# Patient Record
Sex: Male | Born: 1955 | Race: White | Hispanic: No | Marital: Married | Smoking: Former smoker
Health system: Southern US, Community
[De-identification: ages and names within clinical notes are randomized; demographics above are authoritative.]

## PROBLEM LIST (undated history)

## (undated) DIAGNOSIS — I4891 Unspecified atrial fibrillation: Secondary | ICD-10-CM

## (undated) DIAGNOSIS — E785 Hyperlipidemia, unspecified: Secondary | ICD-10-CM

## (undated) HISTORY — DX: Unspecified atrial fibrillation: I48.91

## (undated) HISTORY — DX: Hyperlipidemia, unspecified: E78.5

---
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
## 2006-06-21 ENCOUNTER — Ambulatory Visit (HOSPITAL_COMMUNITY): Admission: RE | Admit: 2006-06-21 | Discharge: 2006-06-21 | Payer: Self-pay | Admitting: Family Medicine

## 2007-09-28 ENCOUNTER — Ambulatory Visit: Payer: Self-pay | Admitting: Gastroenterology

## 2007-10-07 ENCOUNTER — Telehealth: Payer: Self-pay | Admitting: Gastroenterology

## 2007-10-11 ENCOUNTER — Telehealth: Payer: Self-pay | Admitting: Gastroenterology

## 2007-10-12 ENCOUNTER — Ambulatory Visit: Payer: Self-pay | Admitting: Gastroenterology

## 2010-01-21 ENCOUNTER — Encounter (INDEPENDENT_AMBULATORY_CARE_PROVIDER_SITE_OTHER): Payer: Self-pay | Admitting: *Deleted

## 2010-03-04 ENCOUNTER — Ambulatory Visit
Admission: RE | Admit: 2010-03-04 | Discharge: 2010-03-04 | Payer: Self-pay | Source: Home / Self Care | Attending: Gastroenterology | Admitting: Gastroenterology

## 2010-03-04 ENCOUNTER — Encounter: Payer: Self-pay | Admitting: Gastroenterology

## 2010-03-04 ENCOUNTER — Other Ambulatory Visit: Payer: Self-pay | Admitting: Gastroenterology

## 2010-03-04 DIAGNOSIS — R195 Other fecal abnormalities: Secondary | ICD-10-CM | POA: Insufficient documentation

## 2010-03-04 LAB — CBC WITH DIFFERENTIAL/PLATELET
Basophils Absolute: 0.1 10*3/uL (ref 0.0–0.1)
Basophils Relative: 1 % (ref 0.0–3.0)
Eosinophils Absolute: 0.2 10*3/uL (ref 0.0–0.7)
Eosinophils Relative: 2.1 % (ref 0.0–5.0)
HCT: 43.5 % (ref 39.0–52.0)
Hemoglobin: 15.2 g/dL (ref 13.0–17.0)
Lymphocytes Relative: 31.7 % (ref 12.0–46.0)
Lymphs Abs: 3.1 10*3/uL (ref 0.7–4.0)
MCHC: 34.9 g/dL (ref 30.0–36.0)
MCV: 92.2 fl (ref 78.0–100.0)
Monocytes Absolute: 0.7 10*3/uL (ref 0.1–1.0)
Monocytes Relative: 7.5 % (ref 3.0–12.0)
Neutro Abs: 5.6 10*3/uL (ref 1.4–7.7)
Neutrophils Relative %: 57.7 % (ref 43.0–77.0)
Platelets: 182 10*3/uL (ref 150.0–400.0)
RBC: 4.72 Mil/uL (ref 4.22–5.81)
RDW: 12.6 % (ref 11.5–14.6)
WBC: 9.7 10*3/uL (ref 4.5–10.5)

## 2010-03-26 NOTE — Letter (Signed)
Summary: New Patient letter  [facility]  [address]   [place], [place] [address]   Phone: [phone]  Fax: [phone]       01/21/2010 MRN: [id number]  Aaron Wise [address] [place], [place]  [address]  Dear Mr. Appelhans,  Welcome to the Gastroenterology Division at [facility].    You are scheduled to see Dr. Arlyce Dice on 03/04/2010 at 2:00 on the 3rd floor at [facility], [address]  We ask that you try to arrive at our office 15 minutes prior to your appointment time to allow for check-in.  We would like you to complete the enclosed self-administered evaluation form prior to your visit and bring it with you on the day of your appointment.  We will review it with you.  Also, please bring a complete list of all your medications or, if you prefer, bring the medication bottles and we will list them.  Please bring your insurance card so that we may make a copy of it.  If your insurance requires a referral to see a specialist, please bring your referral form from your primary care physician.  Co-payments are due at the time of your visit and may be paid by cash, check or credit card.     Your office visit will consist of a consult with your physician (includes a physical exam), any laboratory testing he/she may order, scheduling of any necessary diagnostic testing (e.g. x-ray, ultrasound, CT-scan), and scheduling of a procedure (e.g. Endoscopy, Colonoscopy) if required.  Please allow enough time on your schedule to allow for any/all of these possibilities.    If you cannot keep your appointment, please call [phone] to cancel or reschedule prior to your appointment date.  This allows [place] the opportunity to schedule an appointment for another patient in need of care.  If you do not cancel or reschedule by 5 p.m. the business day prior to your appointment date, you will be charged a $50.00 late cancellation/no-show fee.    Thank you for choosing  [facility] for your medical needs.  We appreciate the opportunity to care for you.  Please visit [place] at our website  to learn more about our practice.                     Sincerely,                                                             The Gastroenterology Division

## 2010-03-28 NOTE — Assessment & Plan Note (Signed)
Summary: Blood in stool..jj.   History of Present Illness Visit Type: Initial Consult Primary GI MD: Melvia Heaps MD [facility] Primary Provider: Jackalyn Lombard MD Requesting Provider: Jackalyn Lombard MD Chief Complaint: hem positive stool History of Present Illness:   Aaron Wise is a pleasant 55 year old white male referred at the request of Dr. Ludwig Clarks for Hemoccult-positive stools.  This was noted on routine testing.  [name] has no GI complaints including change of bowel habits, abdominal or rectal pain.  Rarely has he seen a minimal amount of blood on the toilet tissue that he attributes to hemorrhoids.  Screening colonoscopy in 2009 was remarkable only for scattered diverticula.  He is on no gastric irritants including nonsteroidals.  He has no history of upper GI complaints or disease.   GI Review of Systems      Denies abdominal pain, acid reflux, belching, bloating, chest pain, dysphagia with liquids, dysphagia with solids, heartburn, loss of appetite, nausea, vomiting, vomiting blood, weight loss, and  weight gain.      Reports constipation, heme positive stool, and  hemorrhoids.     Denies anal fissure, black tarry stools, change in bowel habit, diarrhea, diverticulosis, fecal incontinence, irritable bowel syndrome, jaundice, light color stool, liver problems, rectal bleeding, and  rectal pain.    Current Medications (verified): 1)  Simvastatin 40 Mg Tabs (Simvastatin) .Marland Kitchen.. 1 By Mouth Once Daily  Allergies (verified): No Known Drug Allergies  Past History:  Past Medical History:  Diverticulosis Hyperlipidemia  Past Surgical History: Hernia Surgery  Family History: No FH of Colon Cancer: father skin cancer in neck mother cancer in the jaw  Social History: Patient currently smokes. 1 ppd  Alcohol Use - yes 1-2 per day Daily Caffeine Use 4-6 per day Illicit Drug Use - no Patient gets regular exercise. Smoking Status:  current Drug Use:  no Does Patient  Exercise:  yes  Review of Systems       The patient complains of back pain.         All other systems were reviewed and were negative   Vital Signs:  Patient profile:   55 year old male Height:      71 inches Weight:      235 pounds BMI:     32.89 Pulse rate:   72 / minute Pulse rhythm:   regular BP sitting:   110 / 70  (right arm)  Vitals Entered By: Chales Abrahams CMA Duncan Dull) (March 04, 2010 2:04 PM)  Physical Exam  Additional Exam:  N. physical exam he is a well-developed well-nourished male  skin: anicter  HEENT: normocephalic; PEERLA; no nasal or orpharyngeal abnormalities neck: supple nodes: no cervical adenopathy chest: clear cor:  no murmurs, gallops or rubs abd:  bowel sounds normoactive; no abdominal masses, tenderness, organomegaly rectal: no masses; stool heme negative ext: no cyanosis, clubbing, or edema skeletal: no gross skeletal abnormalities neuro: alert, oriented x 3; no focal abnormalities    Impression & Recommendations:  Problem # 1:  FECAL OCCULT BLOOD (ICD-792.1) Hemoccult-Positive stool very well could be due to hemorrhoidal disease.  Colonoscopy in 2009 demonstrating  diverticulosis renders a more proximal colonic bleeding  source lesslikely.  Occult upper GI bleeding is a consideration.  Recommendations #1 CBC #2 followup Hemoccults #3 unless the patient is anemic or persistent Hemoccult-positive I would not pursue his GI workup any further at this point Orders: TLB-CBC Platelet - w/Differential ([id number]-[id number])  Patient Instructions: 1)  Copy sent to : Jackalyn Lombard MD

## 2010-03-28 NOTE — Letter (Signed)
Summary: [facility]: Immunoassay Fecal Occult Blood (iFOB) Order Form  [facility]  [address] [place], [place] [address]   Phone: [phone]  Fax: [phone]      [facility]: Immunoassay Fecal Occult Blood (iFOB) Order Form   March 04, 2010 MRN: [id number]   [name] 07/10/1955   Physicican Name:ROBERT KAPLAN Diagnosis Code:792.1      Merri Ray CMA (AAMA)

## 2010-03-28 NOTE — Letter (Signed)
Summary: Results Letter  [facility]  [address] [place], [place] [address]   Phone: [phone]  Fax: [phone]        March 04, 2010 MRN: [id number]    ADLAI SINNING [address] [place], [place]  [address]    Dear Mr. Hollerbach,  It is my pleasure to have treated you recently as a new patient in my office. I appreciate your confidence and the opportunity to participate in your care.  Since I do have a busy inpatient endoscopy schedule and office schedule, my office hours vary weekly. I am, however, available for emergency calls everyday through my office. If I am not available for an urgent office appointment, another one of our gastroenterologist will be able to assist you.  My well-trained staff are prepared to help you at all times. For emergencies after office hours, a physician from our Gastroenterology section is always available through my 24 hour answering service  Once again I welcome you as a new patient and I look forward to a happy and healthy relationship             Sincerely,  Louis Meckel MD  This letter has been electronically signed by your physician.  Appended Document: Results Letter LETTER MAILED

## 2010-04-11 ENCOUNTER — Encounter (INDEPENDENT_AMBULATORY_CARE_PROVIDER_SITE_OTHER): Payer: Self-pay | Admitting: *Deleted

## 2010-04-11 ENCOUNTER — Other Ambulatory Visit: Payer: Self-pay | Admitting: Gastroenterology

## 2010-04-11 ENCOUNTER — Other Ambulatory Visit: Payer: Self-pay

## 2010-04-11 DIAGNOSIS — R195 Other fecal abnormalities: Secondary | ICD-10-CM

## 2010-04-11 LAB — FECAL OCCULT BLOOD, IMMUNOCHEMICAL: Fecal Occult Bld: NEGATIVE

## 2010-04-12 ENCOUNTER — Encounter: Payer: Self-pay | Admitting: Gastroenterology

## 2010-04-17 NOTE — Letter (Signed)
Summary: Results Letter  [facility]  [address] [place], [place] [address]   Phone: [phone]  Fax: [phone]        April 12, 2010 MRN: [id number]    WILIAM CAUTHORN [address] [place], [place]  [address]    Dear Mr. Madeira,  Your hemeoccult cards testing for microscopic bleeding were negative.  No further GI workup is required at this time.   Please continue with the recommendations that we previously discussed. Should you have any further questions or concerns, feel free to contact me.    Sincerely,  Barbette Hair. Arlyce Dice, M.D., [facility]          Sincerely,  Louis Meckel MD  This letter has been electronically signed by your physician.  Appended Document: Results Letter Letter is mailed to the patient's home address

## 2011-02-25 HISTORY — PX: OTHER SURGICAL HISTORY: SHX169

## 2011-04-05 ENCOUNTER — Encounter (HOSPITAL_COMMUNITY): Payer: Self-pay | Admitting: Emergency Medicine

## 2011-04-05 ENCOUNTER — Emergency Department (HOSPITAL_COMMUNITY): Payer: Managed Care, Other (non HMO)

## 2011-04-05 ENCOUNTER — Emergency Department (HOSPITAL_COMMUNITY)
Admission: EM | Admit: 2011-04-05 | Discharge: 2011-04-05 | Disposition: A | Discharge status: Home / Self Care | Payer: Managed Care, Other (non HMO) | Attending: Emergency Medicine | Admitting: Emergency Medicine

## 2011-04-05 ENCOUNTER — Other Ambulatory Visit: Payer: Self-pay

## 2011-04-05 DIAGNOSIS — IMO0002 Reserved for concepts with insufficient information to code with codable children: Secondary | ICD-10-CM

## 2011-04-05 DIAGNOSIS — W19XXXA Unspecified fall, initial encounter: Secondary | ICD-10-CM

## 2011-04-05 DIAGNOSIS — W010XXA Fall on same level from slipping, tripping and stumbling without subsequent striking against object, initial encounter: Secondary | ICD-10-CM | POA: Insufficient documentation

## 2011-04-05 DIAGNOSIS — Z79899 Other long term (current) drug therapy: Secondary | ICD-10-CM | POA: Insufficient documentation

## 2011-04-05 DIAGNOSIS — F17200 Nicotine dependence, unspecified, uncomplicated: Secondary | ICD-10-CM | POA: Insufficient documentation

## 2011-04-05 DIAGNOSIS — S42409A Unspecified fracture of lower end of unspecified humerus, initial encounter for closed fracture: Secondary | ICD-10-CM | POA: Insufficient documentation

## 2011-04-05 DIAGNOSIS — M25529 Pain in unspecified elbow: Secondary | ICD-10-CM | POA: Insufficient documentation

## 2011-04-05 LAB — URINALYSIS, ROUTINE W REFLEX MICROSCOPIC
Glucose, UA: NEGATIVE mg/dL
Leukocytes, UA: NEGATIVE
Protein, ur: NEGATIVE mg/dL
Specific Gravity, Urine: 1.03 (ref 1.005–1.030)
Urobilinogen, UA: 0.2 mg/dL (ref 0.0–1.0)

## 2011-04-05 LAB — CBC
MCH: 32 pg (ref 26.0–34.0)
Platelets: 224 10*3/uL (ref 150–400)
RBC: 5.22 MIL/uL (ref 4.22–5.81)
WBC: 14.2 10*3/uL — ABNORMAL HIGH (ref 4.0–10.5)

## 2011-04-05 LAB — BASIC METABOLIC PANEL
CO2: 23 mEq/L (ref 19–32)
Calcium: 9.4 mg/dL (ref 8.4–10.5)
Chloride: 105 mEq/L (ref 96–112)
Potassium: 4.1 mEq/L (ref 3.5–5.1)
Sodium: 140 mEq/L (ref 135–145)

## 2011-04-05 MED ORDER — MORPHINE SULFATE 4 MG/ML IJ SOLN: 4.0000 mg | Freq: Once | INTRAMUSCULAR | Status: DC

## 2011-04-05 MED ORDER — HYDROMORPHONE HCL 2 MG PO TABS
2.0000 mg | ORAL_TABLET | ORAL | Status: AC
  Administered 2011-04-05: 2 mg via ORAL
  Filled 2011-04-05: qty 1

## 2011-04-05 MED ORDER — FENTANYL CITRATE 0.05 MG/ML IJ SOLN
50.0000 ug | Freq: Once | INTRAMUSCULAR | Status: AC
  Administered 2011-04-05: 50 ug via INTRAVENOUS
  Filled 2011-04-05: qty 2

## 2011-04-05 MED ORDER — HYDROMORPHONE HCL PF 1 MG/ML IJ SOLN
1.0000 mg | Freq: Once | INTRAMUSCULAR | Status: AC
  Administered 2011-04-05: 1 mg via INTRAVENOUS
  Filled 2011-04-05: qty 1

## 2011-04-05 MED ORDER — HYDROMORPHONE HCL 2 MG PO TABS: 2.0000 mg | ORAL_TABLET | ORAL | Status: AC | PRN

## 2011-04-05 MED ORDER — OXYCODONE-ACETAMINOPHEN 5-325 MG PO TABS
1.0000 | ORAL_TABLET | Freq: Once | ORAL | Status: AC
  Administered 2011-04-05: 1 via ORAL
  Filled 2011-04-05: qty 1

## 2011-04-05 NOTE — ED Notes (Signed)
Patient and spouse verbalized complete understanding of all the d/c instructions and medication administrations

## 2011-04-05 NOTE — ED Notes (Signed)
Pt reports tripping and falling on cement driveway this am. L/elbow and l/side struck concrete

## 2011-04-05 NOTE — ED Provider Notes (Addendum)
56 year old male tripped and fell injuring his left elbow. Neurovascular exam is intact with normal sensation on, normal motor function, strong pulses, and prompt capillary refill. X-ray shows a fracture of the lateral condyle of the humerus with displacement and probably will need open reduction and internal fixation. Consultation will be obtained with hand surgery.  Dione Booze, MD 04/05/11 [phone]  Case has been discussed with Dr. Broadus John gold who has reviewed the x-rays and we'll do open reduction internal fixation as an outpatient in 2 days. This information is relayed to the patient. He will be placed in a long-arm posterior splint for immobilization until then and will be sent home with a prescription for hydromorphone for pain.  ECG shows normal sinus rhythm with a rate of 79, no ectopy. Normal axis. Normal P wave. Normal QRS. Normal intervals. Normal ST and T waves. Impression: normal ECG.no old ECG available for comparison.   Dione Booze, MD 04/05/11 0945  Dione Booze, MD 04/05/11 [phone]

## 2011-04-05 NOTE — ED Provider Notes (Signed)
History     CSN: [id number]  Arrival date & time 04/05/11  4098   First MD Initiated Contact with Patient 04/05/11 [phone]      Chief Complaint  Patient presents with  . Arm Injury    Pt. tripped fell at 0630.Reports  L/arm pain , L/elbow pain. Unable to move l/arm due to pain. Denies LOC.     (Consider location/radiation/quality/duration/timing/severity/associated sxs/prior treatment) HPI Comments: Patient reports he was walking his dog to the mailbox and stepped on his shoelace, causing him to fall.  States he fell on his left side, injuring his left elbow.  Denies hitting head or LOC.  Denies any other pain or injury besides his left elbow.    Patient is a 56 y.o. male presenting with arm injury. The history is provided by the patient.  Arm Injury  The incident occurred just prior to arrival. The injury mechanism was a fall. No protective equipment was used. He came to the ER via personal transport. There is an injury to the left elbow. The pain is moderate. Pertinent negatives include no headaches and no neck pain. He has been behaving normally.    History reviewed. No pertinent past medical history.  Past Surgical History  Procedure Date  . Hernia repair     Family History  Problem Relation Age of Onset  . Cancer Father     History  Substance Use Topics  . Smoking status: Current Everyday Smoker  . Smokeless tobacco: Not on file  . Alcohol Use: Yes      Review of Systems  HENT: Negative for neck pain.   Musculoskeletal: Negative for back pain.  Neurological: Negative for headaches.  All other systems reviewed and are negative.    Allergies  Review of patient's allergies indicates no known allergies.  Home Medications   Current Outpatient Rx  Name Route Sig Dispense Refill  . SIMVASTATIN 40 MG PO TABS Oral Take 40 mg by mouth every evening.      BP 106/65  Pulse 75  Temp(Src) 97.8 F (36.6 C) (Oral)  Resp 18  SpO2 96%  Physical Exam  Nursing note  and vitals reviewed. Constitutional: He is oriented to person, place, and time. He appears well-developed and well-nourished.  HENT:  Head: Normocephalic and atraumatic.  Neck: Neck supple.  Pulmonary/Chest: Effort normal.  Musculoskeletal:       Left shoulder: He exhibits no tenderness and no bony tenderness.       Left elbow: He exhibits decreased range of motion. tenderness found.       Left wrist: He exhibits no tenderness and no bony tenderness.       Cervical back: He exhibits no tenderness and no bony tenderness.       Thoracic back: He exhibits no tenderness and no bony tenderness.       Lumbar back: He exhibits no tenderness and no bony tenderness.       Left hand: He exhibits no tenderness and no bony tenderness.       Patient holding elbow close to body with ice pack.  Does not move elbow.  Grip strength in left hand decreased secondary to causing pain in elbow.  Distal pulses intact, sensation intact.   Neurological: He is alert and oriented to person, place, and time. He exhibits normal muscle tone.  Psychiatric: He has a normal mood and affect. His behavior is normal. Thought content normal.    ED Course  Procedures (including critical care time)  Labs  Reviewed - No data to display Dg Chest 2 View  04/05/2011  *RADIOLOGY REPORT*  Clinical Data: Preop for elbow fracture  CHEST - 2 VIEW  Comparison: None.  Findings: Cardiomediastinal silhouette is unremarkable.  No acute infiltrate or pleural effusion.  No pulmonary edema.  Mild left basilar atelectasis.  IMPRESSION: No acute infiltrate or pulmonary edema.  Mild left basilar atelectasis.  Original Report Authenticated By: Natasha Mead, M.D.   Dg Elbow 2 Views Left  04/05/2011  *RADIOLOGY REPORT*  Clinical Data: Larey Seat.  Left elbow pain.  LEFT ELBOW - 2 VIEW  Comparison: None  Findings: Two limited views of the elbow demonstrate a comminuted intra-articular fracture of the distal humerus.  No obvious fracture of the radius or ulna.   IMPRESSION: Comminuted intra-articular fracture of the distal humerus.  Original Report Authenticated By: P. Loralie Champagne, M.D.    8:49 AM Patient moved to back from fast track due to severity of injury and need for IV pain control.  Patient unable to tolerate all films of elbow.  I have spoken with Dr Preston Fleeting about the patient - he will see patient and consult hand surgeon.      1. Fall   2. Fracture of elbow, condyle, left, closed       MDM  Patient tripped and fell in his driveway this morning, sustaining closed fracture to left elbow.  Pain controlled in ED.  Order for splint, patient's disposition and discharge set by Dr Preston Fleeting after he spoke with Dr Mina Marble.          Dillard Cannon [place], [place] 04/05/11 [phone]

## 2011-04-05 NOTE — ED Provider Notes (Signed)
Medical screening examination/treatment/procedure(s) were conducted as a shared visit with non-physician practitioner(s) and myself.  I personally evaluated the patient during the encounter   Dione Booze, MD 04/05/11 1544

## 2011-04-10 ENCOUNTER — Emergency Department (HOSPITAL_COMMUNITY): Payer: Managed Care, Other (non HMO)

## 2011-04-10 ENCOUNTER — Other Ambulatory Visit: Payer: Self-pay

## 2011-04-10 ENCOUNTER — Emergency Department (HOSPITAL_COMMUNITY)
Admission: EM | Admit: 2011-04-10 | Discharge: 2011-04-10 | Disposition: A | Discharge status: Home / Self Care | Payer: Managed Care, Other (non HMO) | Attending: Emergency Medicine | Admitting: Emergency Medicine

## 2011-04-10 ENCOUNTER — Encounter (HOSPITAL_COMMUNITY): Payer: Self-pay | Admitting: Emergency Medicine

## 2011-04-10 DIAGNOSIS — R079 Chest pain, unspecified: Secondary | ICD-10-CM | POA: Insufficient documentation

## 2011-04-10 DIAGNOSIS — F17200 Nicotine dependence, unspecified, uncomplicated: Secondary | ICD-10-CM | POA: Insufficient documentation

## 2011-04-10 DIAGNOSIS — J189 Pneumonia, unspecified organism: Secondary | ICD-10-CM | POA: Insufficient documentation

## 2011-04-10 DIAGNOSIS — R05 Cough: Secondary | ICD-10-CM | POA: Insufficient documentation

## 2011-04-10 DIAGNOSIS — R0602 Shortness of breath: Secondary | ICD-10-CM | POA: Insufficient documentation

## 2011-04-10 DIAGNOSIS — R059 Cough, unspecified: Secondary | ICD-10-CM | POA: Insufficient documentation

## 2011-04-10 LAB — BASIC METABOLIC PANEL
CO2: 24 mEq/L (ref 19–32)
Chloride: 103 mEq/L (ref 96–112)
Sodium: 138 mEq/L (ref 135–145)

## 2011-04-10 LAB — DIFFERENTIAL
Basophils Absolute: 0 10*3/uL (ref 0.0–0.1)
Lymphocytes Relative: 13 % (ref 12–46)
Neutro Abs: 10.8 10*3/uL — ABNORMAL HIGH (ref 1.7–7.7)

## 2011-04-10 LAB — CBC
HCT: 39.4 % (ref 39.0–52.0)
Platelets: 215 10*3/uL (ref 150–400)
RDW: 12.1 % (ref 11.5–15.5)
WBC: 13.6 10*3/uL — ABNORMAL HIGH (ref 4.0–10.5)

## 2011-04-10 MED ORDER — HYDROMORPHONE HCL PF 1 MG/ML IJ SOLN
1.0000 mg | Freq: Once | INTRAMUSCULAR | Status: AC
  Administered 2011-04-10: 1 mg via INTRAVENOUS
  Filled 2011-04-10: qty 1

## 2011-04-10 MED ORDER — IOHEXOL 300 MG/ML  SOLN
100.0000 mL | Freq: Once | INTRAMUSCULAR | Status: AC | PRN
  Administered 2011-04-10: 100 mL via INTRAVENOUS

## 2011-04-10 MED ORDER — ACETAMINOPHEN 325 MG PO TABS
650.0000 mg | ORAL_TABLET | Freq: Once | ORAL | Status: AC
  Filled 2011-04-10: qty 2

## 2011-04-10 MED ORDER — HYDROMORPHONE HCL PF 1 MG/ML IJ SOLN
1.0000 mg | Freq: Once | INTRAMUSCULAR | Status: AC
  Filled 2011-04-10: qty 1

## 2011-04-10 MED ORDER — SODIUM CHLORIDE 0.9 % IV SOLN
Freq: Once | INTRAVENOUS | Status: AC
  Administered 2011-04-10: 15:00:00 via INTRAVENOUS

## 2011-04-10 MED ORDER — DEXTROSE 5 % IV SOLN
1.0000 g | INTRAVENOUS | Status: DC
  Administered 2011-04-10: 1 g via INTRAVENOUS
  Filled 2011-04-10: qty 10

## 2011-04-10 MED ORDER — ONDANSETRON HCL 4 MG/2ML IJ SOLN
4.0000 mg | Freq: Once | INTRAMUSCULAR | Status: AC
  Administered 2011-04-10: 4 mg via INTRAVENOUS
  Filled 2011-04-10: qty 2

## 2011-04-10 MED ORDER — ACETAMINOPHEN 325 MG PO TABS
650.0000 mg | ORAL_TABLET | Freq: Once | ORAL | Status: AC
  Administered 2011-04-10: 650 mg via ORAL

## 2011-04-10 MED ORDER — AMOXICILLIN-POT CLAVULANATE 875-125 MG PO TABS: 1.0000 | ORAL_TABLET | Freq: Two times a day (BID) | ORAL | Status: AC

## 2011-04-10 MED ORDER — HYDROMORPHONE HCL PF 1 MG/ML IJ SOLN
1.0000 mg | Freq: Once | INTRAMUSCULAR | Status: AC
  Administered 2011-04-10: 1 mg via INTRAVENOUS

## 2011-04-10 MED ORDER — DEXTROSE 5 % IV SOLN
500.0000 mg | INTRAVENOUS | Status: DC
  Administered 2011-04-10 (×2): 500 mg via INTRAVENOUS
  Filled 2011-04-10: qty 500

## 2011-04-10 NOTE — ED Provider Notes (Signed)
History     CSN: [id number]  Arrival date & time 04/10/11  1223   First MD Initiated Contact with Patient 04/10/11 1421      Chief Complaint  Patient presents with  . Shortness of Breath  . Chest Pain    (Consider location/radiation/quality/duration/timing/severity/associated sxs/prior treatment) Patient is a 56 y.o. male presenting with shortness of breath and chest pain. The history is provided by the patient and the spouse.  Shortness of Breath  Associated symptoms include chest pain and shortness of breath.  Chest Pain Primary symptoms include shortness of breath.   pt here with cough and right sided chest pain x 3 days--recent left elbow surgery--pain described as sharp, pleuritic--no exertional component-no le pain or swelling--denies vomiting or diarrhea--saw his md today and office xray with right sided infiltrate--sent her for eval  History reviewed. No pertinent past medical history.  Past Surgical History  Procedure Date  . Hernia repair     Family History  Problem Relation Age of Onset  . Cancer Father     History  Substance Use Topics  . Smoking status: Current Everyday Smoker  . Smokeless tobacco: Not on file  . Alcohol Use: Yes      Review of Systems  Respiratory: Positive for shortness of breath.   Cardiovascular: Positive for chest pain.  All other systems reviewed and are negative.    Allergies  Review of patient's allergies indicates no known allergies.  Home Medications   Current Outpatient Rx  Name Route Sig Dispense Refill  . ACETAMINOPHEN 500 MG PO TABS Oral Take 1,000 mg by mouth every 6 (six) hours as needed. For pain    . HYDROMORPHONE HCL 2 MG PO TABS Oral Take 1 tablet (2 mg total) by mouth every 4 (four) hours as needed for pain. 20 tablet 0  . SIMVASTATIN 40 MG PO TABS Oral Take 40 mg by mouth every evening.      BP 119/70  Pulse 102  Temp(Src) 99.9 F (37.7 C) (Oral)  SpO2 96%  Physical Exam  Nursing note and vitals  reviewed. Constitutional: He is oriented to person, place, and time. He appears well-developed and well-nourished.  Non-toxic appearance. No distress.  HENT:  Head: Normocephalic and atraumatic.  Eyes: Conjunctivae, EOM and lids are normal. Pupils are equal, round, and reactive to light.  Neck: Normal range of motion. Neck supple. No tracheal deviation present. No mass present.  Cardiovascular: Regular rhythm and normal heart sounds.  Tachycardia present.  Exam reveals no gallop.   No murmur heard. Pulmonary/Chest: Effort normal. No stridor. No respiratory distress. He has decreased breath sounds. He has no wheezes. He has no rhonchi. He has no rales.  Abdominal: Soft. Normal appearance and bowel sounds are normal. He exhibits no distension. There is no tenderness. There is no rebound and no CVA tenderness.  Musculoskeletal: Normal range of motion. He exhibits no edema and no tenderness.       Arms: Neurological: He is alert and oriented to person, place, and time. He has normal strength. No cranial nerve deficit or sensory deficit. GCS eye subscore is 4. GCS verbal subscore is 5. GCS motor subscore is 6.  Skin: Skin is warm and dry. No abrasion and no rash noted.  Psychiatric: He has a normal mood and affect. His speech is normal and behavior is normal.    ED Course  Procedures (including critical care time)   Labs Reviewed  CBC  DIFFERENTIAL  BASIC METABOLIC PANEL   No  results found.   No diagnosis found.    MDM  Pts chest ct neg for pe, but did show pneumonia--pt given iv abx and vitals have been stable--d/w about possible admission, he would like to go home and return if worse--pt placed on abx and d/c        Toy Baker, MD 04/11/11 [phone]

## 2011-04-10 NOTE — ED Notes (Signed)
Received phone call from Dr. Mina Marble that pt is being referred here.  CXR was negative at his office, pt had surgery on Monday.  He would like for Attending MD to phone him at [phone].

## 2011-04-10 NOTE — ED Notes (Signed)
Pt c/o SOB and midsternal CP x 3 days since having sx on elbow; sx sent here for eval; pt sts difficult time catching breath; pt tachypnic at present

## 2011-04-10 NOTE — ED Notes (Signed)
Pt recently had surgery on his left elbow following fall and fracture. He states that since he was discharged home he has been having increasing shortness of breath with associated chest pain. Pt is alert and oriented. Breath sounds are clear and bowel sounds are present. Pt's left arm remains in post op splint. No changes or complaints about left arm besides normal post op pain. Last po pain meds were taken earlier this morning. Iv started and labs obtained. Protocols initiated. Family remains at the bedside.

## 2011-04-10 NOTE — Discharge Instructions (Signed)

## 2012-03-14 IMAGING — CR DG ELBOW 2V*L*
2 series · 2 of 2 positions shown · non-contrast
Comparison: None

CLINICAL DATA: Fell.  Left elbow pain.

LEFT ELBOW - 2 VIEW

[w elbow ap left]
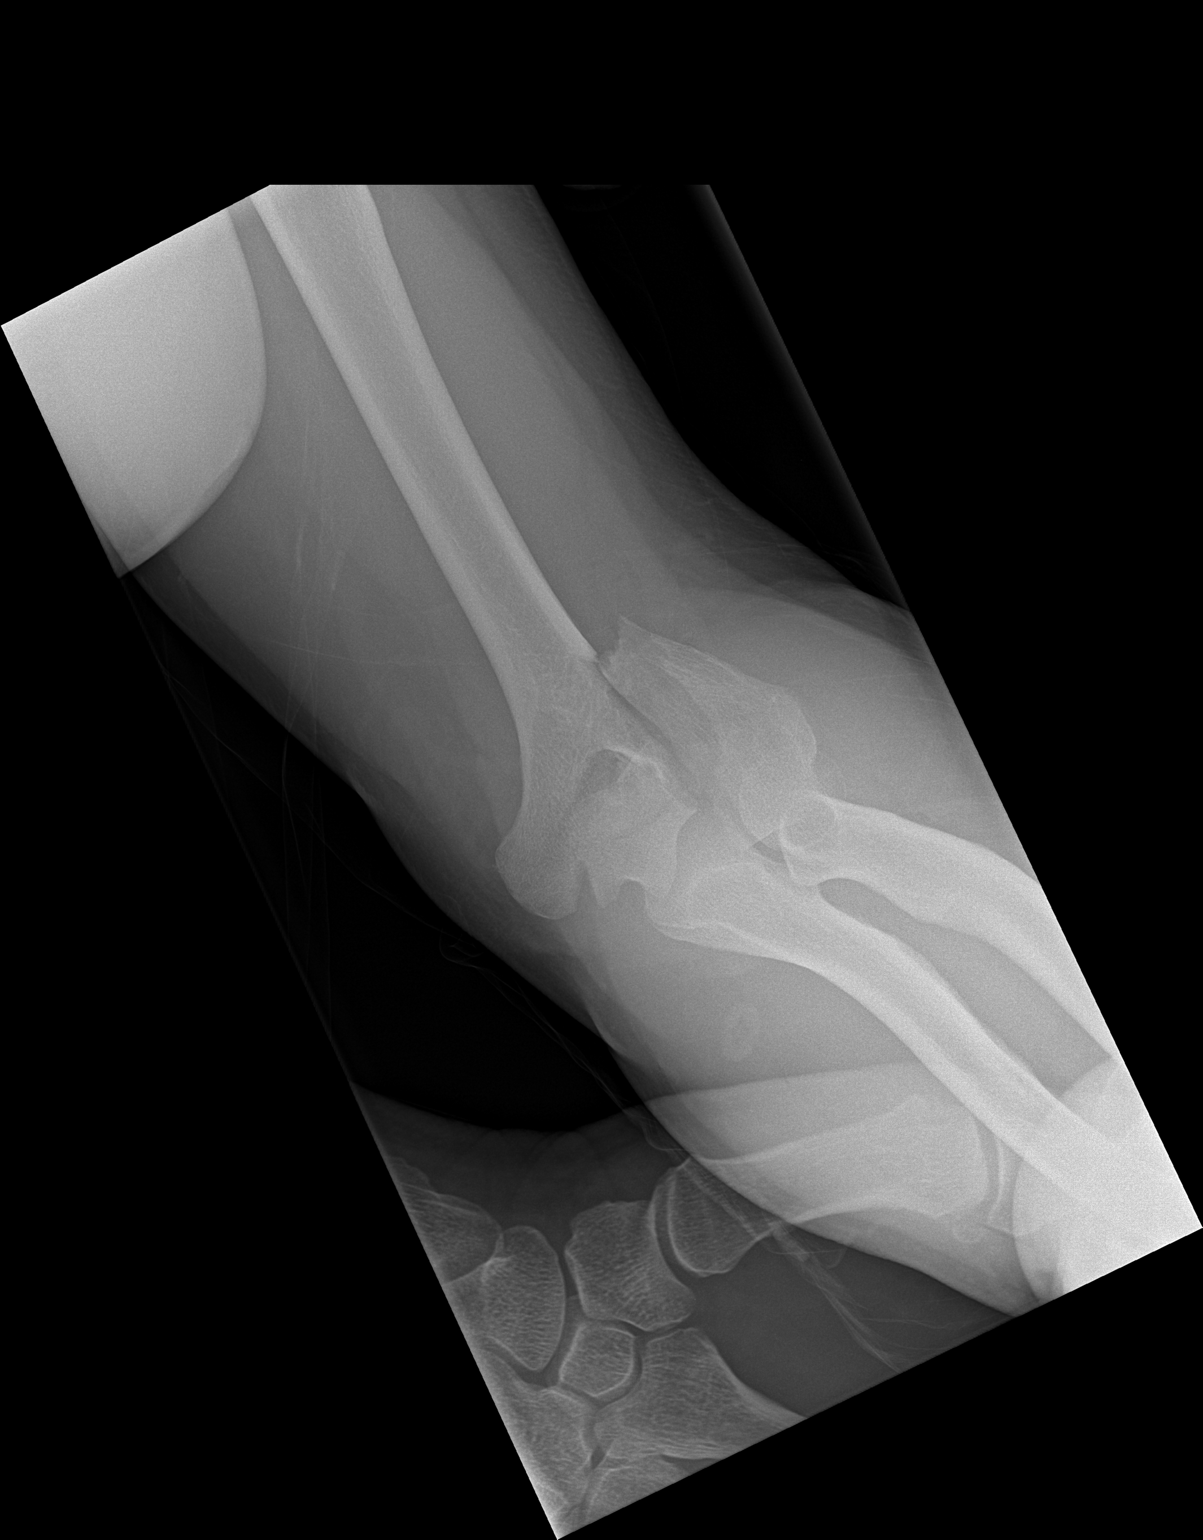

[w elbow lat left]
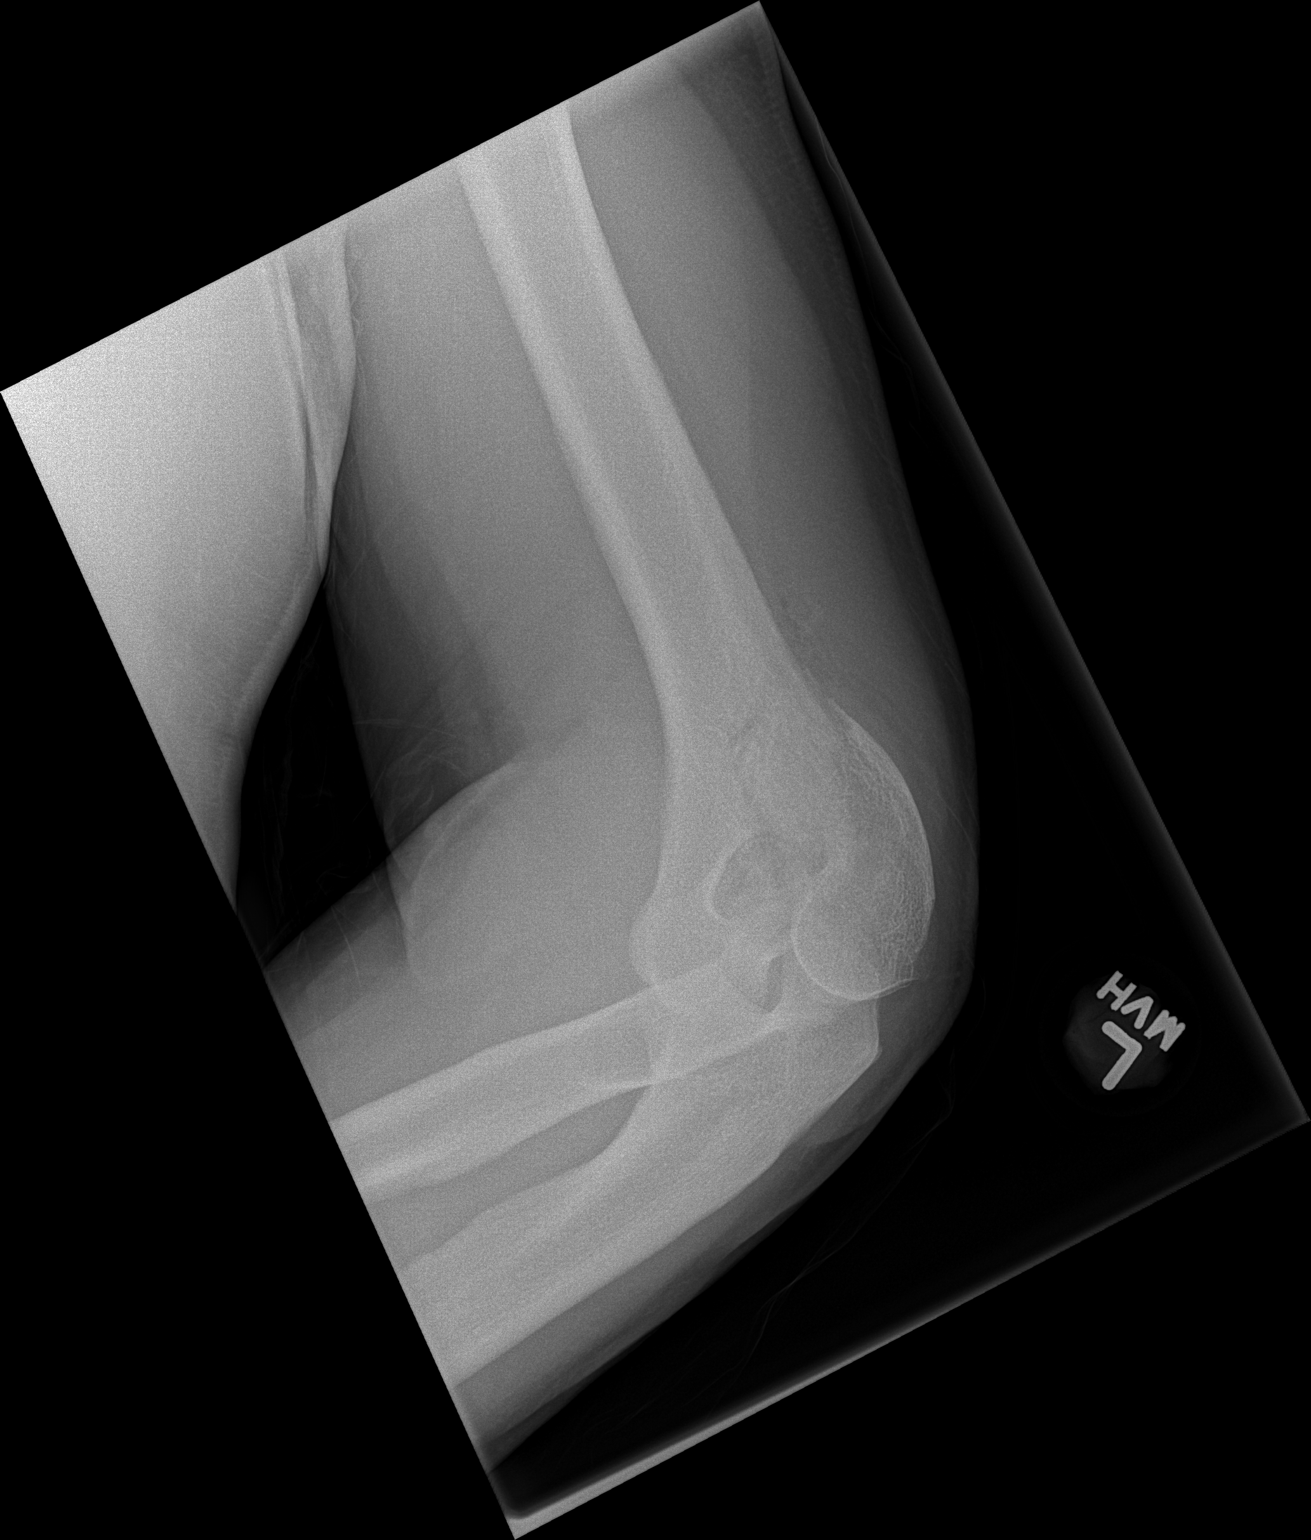

[2 of 2 positions shown; findings below may reference images not displayed]

FINDINGS: Two limited views of the elbow demonstrate a comminuted
intra-articular fracture of the distal humerus.  No obvious
fracture of the radius or ulna.
IMPRESSION: Comminuted intra-articular fracture of the distal humerus.

## 2013-10-06 ENCOUNTER — Encounter: Payer: Self-pay | Admitting: Gastroenterology

## 2017-07-30 ENCOUNTER — Encounter: Payer: Self-pay | Admitting: Gastroenterology

## 2017-08-31 ENCOUNTER — Encounter: Payer: Self-pay | Admitting: Gastroenterology

## 2017-09-25 ENCOUNTER — Ambulatory Visit (AMBULATORY_SURGERY_CENTER): Payer: Self-pay

## 2017-09-25 VITALS — Ht 72.0 in | Wt 235.2 lb

## 2017-09-25 DIAGNOSIS — Z1211 Encounter for screening for malignant neoplasm of colon: Secondary | ICD-10-CM

## 2017-09-25 MED ORDER — PEG-KCL-NACL-NASULF-NA ASC-C 140 G PO SOLR: 1.0000 | Freq: Once | ORAL | Status: AC

## 2017-09-25 NOTE — Progress Notes (Signed)
Per pt, no allergies to soy or egg products.Pt not taking any weight loss meds or using  O2 at home.  Pt refused emmi video. 

## 2017-10-06 ENCOUNTER — Encounter: Payer: Self-pay | Admitting: Gastroenterology

## 2017-10-20 ENCOUNTER — Ambulatory Visit (AMBULATORY_SURGERY_CENTER): Payer: 59 | Admitting: Gastroenterology

## 2017-10-20 ENCOUNTER — Encounter: Payer: Self-pay | Admitting: Gastroenterology

## 2017-10-20 VITALS — BP 123/82 | HR 69 | Temp 96.2°F | Resp 16 | Ht 72.0 in | Wt 235.0 lb

## 2017-10-20 DIAGNOSIS — Z1211 Encounter for screening for malignant neoplasm of colon: Secondary | ICD-10-CM

## 2017-10-20 DIAGNOSIS — D122 Benign neoplasm of ascending colon: Secondary | ICD-10-CM

## 2017-10-20 MED ORDER — SODIUM CHLORIDE 0.9 % IV SOLN: 500.0000 mL | Freq: Once | INTRAVENOUS | Status: AC

## 2017-10-20 NOTE — Op Note (Signed)
[facility] Patient Name: Aaron Wise Procedure Date: 10/20/2017 10:36 AM MRN: [id number] Endoscopist: Mallie Mussel L. Loletha Carrow , MD Age: 62 Referring MD:  Date of Birth: 1955/12/25 Gender: Male Account #: [id number] Procedure:                Colonoscopy Indications:              Screening for colorectal malignant neoplasm (no                            polyps 09/2007) Medicines:                Monitored Anesthesia Care Procedure:                Pre-Anesthesia Assessment:                           - Prior to the procedure, a History and Physical                            was performed, and patient medications and                            allergies were reviewed. The patient's tolerance of                            previous anesthesia was also reviewed. The risks                            and benefits of the procedure and the sedation                            options and risks were discussed with the patient.                            All questions were answered, and informed consent                            was obtained. Prior Anticoagulants: The patient has                            taken no previous anticoagulant or antiplatelet                            agents. ASA Grade Assessment: II - A patient with                            mild systemic disease. After reviewing the risks                            and benefits, the patient was deemed in                            satisfactory condition to undergo the procedure.  After obtaining informed consent, the colonoscope                            was passed under direct vision. Throughout the                            procedure, the patient's blood pressure, pulse, and                            oxygen saturations were monitored continuously. The                            Colonoscope was introduced through the anus and                            advanced to the the terminal ileum, with                     identification of the appendiceal orifice and IC                            valve. The colonoscopy was performed without                            difficulty. The patient tolerated the procedure                            well. The quality of the bowel preparation was                            excellent. The terminal ileum, the appendiceal                            orifice and the rectum were photographed. The                            quality of the bowel preparation was evaluated                            using the BBPS [facility] Bowel Preparation Scale)                            with scores of: Right Colon = 3, Transverse Colon =                            3 and Left Colon = 3 (entire mucosa seen well with                            no residual staining, small fragments of stool or                            opaque liquid). The total BBPS score equals 9. The  bowel preparation used was Plenvu. Scope In: 10:43:27 AM Scope Out: 11:00:04 AM Scope Withdrawal Time: 0 hours 11 minutes 34 seconds  Total Procedure Duration: 0 hours 16 minutes 37 seconds  Findings:                 The perianal and digital rectal examinations were                            normal.                           A 8 mm polyp was found in the ascending colon. The                            polyp was sessile. The polyp was removed with a                            cold snare. Resection and retrieval were complete.                           A 12 mm polyp was found in the ascending colon. The                            polyp was sessile. The polyp was removed with a hot                            snare. Resection and retrieval were complete.                           Multiple diverticula were found in the left colon.                           The exam was otherwise without abnormality on                            direct and retroflexion views. Complications:            No  immediate complications. Estimated Blood Loss:     Estimated blood loss was minimal. Impression:               - One 8 mm polyp in the ascending colon, removed                            with a cold snare. Resected and retrieved.                           - One 12 mm polyp in the ascending colon, removed                            with a hot snare. Resected and retrieved.                           - Diverticulosis in the left colon.                           -  The examination was otherwise normal on direct                            and retroflexion views. Recommendation:           - Patient has a contact number available for                            emergencies. The signs and symptoms of potential                            delayed complications were discussed with the                            patient. Return to normal activities tomorrow.                            Written discharge instructions were provided to the                            patient.                           - Resume previous diet.                           - Continue present medications.                           - Await pathology results.                           - Repeat colonoscopy is recommended for                            surveillance. The colonoscopy date will be                            determined after pathology results from today's                            exam become available for review. Charnele Semple L. Loletha Carrow, MD 10/20/2017 11:05:00 AM This report has been signed electronically.

## 2017-10-20 NOTE — Patient Instructions (Signed)
YOU HAD AN ENDOSCOPIC PROCEDURE TODAY AT THE [facility]:   Refer to the procedure report that was given to you for any specific questions about what was found during the examination.  If the procedure report does not answer your questions, please call your gastroenterologist to clarify.  If you requested that your care partner not be given the details of your procedure findings, then the procedure report has been included in a sealed envelope for you to review at your convenience later.  YOU SHOULD EXPECT: Some feelings of bloating in the abdomen. Passage of more gas than usual.  Walking can help get rid of the air that was put into your GI tract during the procedure and reduce the bloating. If you had a lower endoscopy (such as a colonoscopy or flexible sigmoidoscopy) you may notice spotting of blood in your stool or on the toilet paper. If you underwent a bowel prep for your procedure, you may not have a normal bowel movement for a few days.  Please Note:  You might notice some irritation and congestion in your nose or some drainage.  This is from the oxygen used during your procedure.  There is no need for concern and it should clear up in a day or so.  SYMPTOMS TO REPORT IMMEDIATELY:   Following lower endoscopy (colonoscopy or flexible sigmoidoscopy):  Excessive amounts of blood in the stool  Significant tenderness or worsening of abdominal pains  Swelling of the abdomen that is new, acute  Fever of 100F or higher  For urgent or emergent issues, a gastroenterologist can be reached at any hour by calling [phone].   DIET:  We do recommend a small meal at first, but then you may proceed to your regular diet.  Drink plenty of fluids but you should avoid alcoholic beverages for 24 hours.  ACTIVITY:  You should plan to take it easy for the rest of today and you should NOT DRIVE or use heavy machinery until tomorrow (because of the sedation medicines used during the test).     FOLLOW UP: Our staff will call the number listed on your records the next business day following your procedure to check on you and address any questions or concerns that you may have regarding the information given to you following your procedure. If we do not reach you, we will leave a message.  However, if you are feeling well and you are not experiencing any problems, there is no need to return our call.  We will assume that you have returned to your regular daily activities without incident.  If any biopsies were taken you will be contacted by phone or by letter within the next 1-3 weeks.  Please call us at [phone] if you have not heard about the biopsies in 3 weeks.    SIGNATURES/CONFIDENTIALITY: You and/or your care partner have signed paperwork which will be entered into your electronic medical record.  These signatures attest to the fact that that the information above on your After Visit Summary has been reviewed and is understood.  Full responsibility of the confidentiality of this discharge information lies with you and/or your care-partner. 

## 2017-10-20 NOTE — Progress Notes (Signed)
To PACU, VSS. Report to [email or web address] 

## 2017-10-20 NOTE — Progress Notes (Signed)
Called to room to assist during endoscopic procedure.  Patient ID and intended procedure confirmed with present staff. Received instructions for my participation in the procedure from the performing physician.  

## 2017-10-20 NOTE — Progress Notes (Signed)
Pt's states no medical or surgical changes since previsit or office visit. 

## 2017-10-21 ENCOUNTER — Telehealth: Payer: Self-pay

## 2017-10-21 NOTE — Telephone Encounter (Signed)
  Follow up Call-  Call back number 10/20/2017  Post procedure Call Back phone  # [phone]  Permission to leave phone message Yes  Some recent data might be hidden     Patient questions:  Do you have a fever, pain , or abdominal swelling? No. Pain Score  0 *  Have you tolerated food without any problems? Yes.    Have you been able to return to your normal activities? Yes.    Do you have any questions about your discharge instructions: Diet   No. Medications  No. Follow up visit  No.  Do you have questions or concerns about your Care? No.  Actions: * If pain score is 4 or above: No action needed, pain <4.

## 2017-10-28 ENCOUNTER — Encounter: Payer: Self-pay | Admitting: Gastroenterology

## 2018-01-14 ENCOUNTER — Ambulatory Visit
Admission: RE | Admit: 2018-01-14 | Discharge: 2018-01-14 | Disposition: A | Discharge status: Home / Self Care | Payer: 59 | Source: Ambulatory Visit | Attending: Family Medicine | Admitting: Family Medicine

## 2018-01-14 ENCOUNTER — Other Ambulatory Visit: Payer: Self-pay | Admitting: Family Medicine

## 2018-01-14 DIAGNOSIS — R0602 Shortness of breath: Secondary | ICD-10-CM

## 2021-06-24 ENCOUNTER — Ambulatory Visit
Admission: RE | Admit: 2021-06-24 | Discharge: 2021-06-24 | Disposition: A | Discharge status: Home / Self Care | Payer: Self-pay | Source: Ambulatory Visit | Attending: Family Medicine | Admitting: Family Medicine

## 2021-06-24 ENCOUNTER — Other Ambulatory Visit: Payer: Self-pay | Admitting: Family Medicine

## 2021-06-24 DIAGNOSIS — R52 Pain, unspecified: Secondary | ICD-10-CM

## 2022-03-01 ENCOUNTER — Encounter (HOSPITAL_COMMUNITY): Payer: Self-pay | Admitting: Emergency Medicine

## 2022-03-01 ENCOUNTER — Emergency Department (HOSPITAL_COMMUNITY)
Admission: EM | Admit: 2022-03-01 | Discharge: 2022-03-01 | Disposition: A | Discharge status: Home / Self Care | Payer: Medicare Other | Attending: Emergency Medicine | Admitting: Emergency Medicine

## 2022-03-01 ENCOUNTER — Emergency Department (HOSPITAL_COMMUNITY): Payer: Medicare Other

## 2022-03-01 ENCOUNTER — Other Ambulatory Visit: Payer: Self-pay

## 2022-03-01 DIAGNOSIS — N23 Unspecified renal colic: Secondary | ICD-10-CM | POA: Insufficient documentation

## 2022-03-01 DIAGNOSIS — R1032 Left lower quadrant pain: Secondary | ICD-10-CM | POA: Diagnosis present

## 2022-03-01 LAB — BASIC METABOLIC PANEL
Anion gap: 10 (ref 5–15)
BUN: 25 mg/dL — ABNORMAL HIGH (ref 8–23)
CO2: 22 mmol/L (ref 22–32)
Calcium: 8.9 mg/dL (ref 8.9–10.3)
Chloride: 108 mmol/L (ref 98–111)
Creatinine, Ser: 1.48 mg/dL — ABNORMAL HIGH (ref 0.61–1.24)
GFR, Estimated: 52 mL/min — ABNORMAL LOW (ref >60)
Glucose, Bld: 98 mg/dL (ref 70–99)
Potassium: 4 mmol/L (ref 3.5–5.1)
Sodium: 140 mmol/L (ref 135–145)

## 2022-03-01 LAB — CBC
HCT: 45.8 % (ref 39.0–52.0)
Hemoglobin: 15.6 g/dL (ref 13.0–17.0)
MCH: 30.1 pg (ref 26.0–34.0)
MCHC: 34.1 g/dL (ref 30.0–36.0)
MCV: 88.4 fL (ref 80.0–100.0)
Platelets: 268 10*3/uL (ref 150–400)
RBC: 5.18 MIL/uL (ref 4.22–5.81)
RDW: 12.4 % (ref 11.5–15.5)
WBC: 13.3 10*3/uL — ABNORMAL HIGH (ref 4.0–10.5)
nRBC: 0 % (ref 0.0–0.2)

## 2022-03-01 LAB — URINALYSIS, ROUTINE W REFLEX MICROSCOPIC
Bilirubin Urine: NEGATIVE
Glucose, UA: NEGATIVE mg/dL
Ketones, ur: NEGATIVE mg/dL
Leukocytes,Ua: NEGATIVE
Nitrite: NEGATIVE
Protein, ur: NEGATIVE mg/dL
RBC / HPF: >50 RBC/hpf — ABNORMAL HIGH (ref 0–5)
Specific Gravity, Urine: 1.018 (ref 1.005–1.030)
pH: 6 (ref 5.0–8.0)

## 2022-03-01 MED ORDER — TAMSULOSIN HCL 0.4 MG PO CAPS: 0.4000 mg | ORAL_CAPSULE | Freq: Every day | ORAL | 0 refills | Status: DC

## 2022-03-01 MED ORDER — SODIUM CHLORIDE 0.9 % IV BOLUS
1000.0000 mL | Freq: Once | INTRAVENOUS | Status: AC
  Administered 2022-03-01: 1000 mL via INTRAVENOUS

## 2022-03-01 MED ORDER — HYDROCODONE-ACETAMINOPHEN 5-325 MG PO TABS: 1.0000 | ORAL_TABLET | Freq: Four times a day (QID) | ORAL | 0 refills | Status: DC | PRN

## 2022-03-01 MED ORDER — ONDANSETRON HCL 4 MG/2ML IJ SOLN
4.0000 mg | Freq: Once | INTRAMUSCULAR | Status: AC
  Administered 2022-03-01: 4 mg via INTRAVENOUS
  Filled 2022-03-01: qty 2

## 2022-03-01 MED ORDER — KETOROLAC TROMETHAMINE 15 MG/ML IJ SOLN
15.0000 mg | Freq: Once | INTRAMUSCULAR | Status: AC
  Administered 2022-03-01: 15 mg via INTRAVENOUS
  Filled 2022-03-01: qty 1

## 2022-03-01 MED ORDER — MORPHINE SULFATE (PF) 4 MG/ML IV SOLN
4.0000 mg | Freq: Once | INTRAVENOUS | Status: AC
  Administered 2022-03-01: 4 mg via INTRAVENOUS
  Filled 2022-03-01: qty 1

## 2022-03-01 MED ORDER — ONDANSETRON HCL 4 MG PO TABS: 4.0000 mg | ORAL_TABLET | Freq: Four times a day (QID) | ORAL | 0 refills | Status: DC

## 2022-03-01 NOTE — ED Triage Notes (Signed)
Reports lower left abdominal pain and nausea for the past few days. Dull pain at night 3 nights ago, this AM pain was excruciating.' Tylenol this AM. Denies fevers, denies body aches. Hx of kidney stone in the past.

## 2022-03-01 NOTE — ED Provider Notes (Addendum)
[facility] DEPT Provider Note   CSN: [id number] Arrival date & time: 03/01/22  1109     History  Chief Complaint  Patient presents with   Abdominal Pain   Nausea    Aaron Wise is a 67 y.o. male.  67 year old with prior medical history as detailed below presents for evaluation.  Patient reports intermittent left-sided flank and groin pain.  This is been ongoing for 3 days.  Symptoms are significantly worse over the last 12 to 24 hours which is why he came to the ED.  He took some Tylenol without improvement.  He denies any fever.  He reports prior history of renal colic without required urologic intervention.  Pain is associate with nausea and some vomiting.  He denies urinary symptoms.  He denies bowel movement change.  He denies diarrhea.  The history is provided by the patient and medical records.       Home Medications Prior to Admission medications   Medication Sig Start Date End Date Taking? Authorizing Provider  acetaminophen (TYLENOL) 500 MG tablet Take 1,000 mg by mouth every 6 (six) hours as needed. For pain    [provider]  atorvastatin (LIPITOR) 10 MG tablet Take 10 mg by mouth daily.    [provider]  fenofibrate (TRICOR) 145 MG tablet Take 145 mg by mouth daily.    [provider]  sertraline (ZOLOFT) 50 MG tablet Take 50 mg by mouth daily.    [provider]  simvastatin (ZOCOR) 40 MG tablet Take 40 mg by mouth every evening.    [provider]      Allergies    Patient has no known allergies.    Review of Systems   Review of Systems  All other systems reviewed and are negative.   Physical Exam Updated Vital Signs BP (!) 146/99   Pulse 81   Temp 98 F (36.7 C)   Resp 20   Ht 6' (1.829 m)   Wt 104.3 kg   SpO2 97%   BMI 31.19 kg/m  Physical Exam Vitals and nursing note reviewed.  Constitutional:      General: He is not in acute distress.    Appearance: Normal  appearance. He is well-developed.  HENT:     Head: Normocephalic and atraumatic.  Eyes:     Conjunctiva/sclera: Conjunctivae normal.     Pupils: Pupils are equal, round, and reactive to light.  Cardiovascular:     Rate and Rhythm: Normal rate and regular rhythm.     Heart sounds: Normal heart sounds.  Pulmonary:     Effort: Pulmonary effort is normal. No respiratory distress.     Breath sounds: Normal breath sounds.  Abdominal:     General: There is no distension.     Palpations: Abdomen is soft.     Tenderness: There is no abdominal tenderness.  Musculoskeletal:        General: No deformity. Normal range of motion.     Cervical back: Normal range of motion and neck supple.  Skin:    General: Skin is warm and dry.  Neurological:     General: No focal deficit present.     Mental Status: He is alert and oriented to person, place, and time.     ED Results / Procedures / Treatments   Labs (all labs ordered are listed, but only abnormal results are displayed) Labs Reviewed  CBC - Abnormal; Notable for the following components:  Result Value   WBC 13.3 (*)    All other components within normal limits  URINALYSIS, ROUTINE W REFLEX MICROSCOPIC - Abnormal; Notable for the following components:   APPearance HAZY (*)    Hgb urine dipstick LARGE (*)    RBC / HPF >50 (*)    Bacteria, UA RARE (*)    All other components within normal limits  BASIC METABOLIC PANEL - Abnormal; Notable for the following components:   BUN 25 (*)    Creatinine, Ser 1.48 (*)    GFR, Estimated 52 (*)    All other components within normal limits    EKG EKG Interpretation  Date/Time:  Saturday March 01 2022 11:19:54 EST Ventricular Rate:  81 PR Interval:  164 QRS Duration: 92 QT Interval:  369 QTC Calculation: 429 R Axis:   13 Text Interpretation: Sinus rhythm RSR' in V1 or V2, right VCD or RVH Confirmed by Dene Gentry [phone]) on 03/01/2022 11:35:34 AM  Radiology No results  found.  Procedures Procedures    Medications Ordered in ED Medications  sodium chloride 0.9 % bolus 1,000 mL (has no administration in time range)  ondansetron (ZOFRAN) injection 4 mg (has no administration in time range)  morphine (PF) 4 MG/ML injection 4 mg (has no administration in time range)    ED Course/ Medical Decision Making/ A&P                           Medical Decision Making Amount and/or Complexity of Data Reviewed Labs: ordered. Radiology: ordered.  Risk Prescription drug management.    Medical Screen Complete  This patient presented to the ED with complaint of left sided flank pain.  This complaint involves an extensive number of treatment options. The initial differential diagnosis includes, but is not limited to, renal colic  This presentation is: Acute, Self-Limited, Previously Undiagnosed, Uncertain Prognosis, Complicated, Systemic Symptoms, and Threat to Life/Bodily Function  Patient is presenting with complaint of left-sided flank pain.  Describes symptoms and exam are consistent with likely renal colic.  CT imaging reveals evidence of left-sided ureteral stone.    Patient desires discharge after ED evaluation.  He is comfortable at time of discharge.  Patient does understand need for close outpatient follow-up with both his PCP and also with [facility].  Strict return precautions given and understood.  Importance of close follow-up is repeatedly stressed.  Additional history obtained:  Additional history obtained from Spouse External records from outside sources obtained and reviewed including prior ED visits and prior Inpatient records.    Lab Tests:  I ordered and personally interpreted labs.  The pertinent results include: CBC, BMP, UA,   Imaging Studies ordered:  I ordered imaging studies including CT abdomen pelvis I independently visualized and interpreted obtained imaging which showed left ureteral stone I agree with  the radiologist interpretation.   Cardiac Monitoring:  The patient was maintained on a cardiac monitor.  I personally viewed and interpreted the cardiac monitor which showed an underlying rhythm of: NSR   Medicines ordered:  I ordered medication including morphine, Toradol, IV fluids, Zofran for colic Reevaluation of the patient after these medicines showed that the patient: improved    Problem List / ED Course:  Renal colic   Reevaluation:  After the interventions noted above, I reevaluated the patient and found that they have: improved Disposition:  After consideration of the diagnostic results and the patients response to treatment, I feel that the patent  would benefit from close outpatient follow-up.          Final Clinical Impression(s) / ED Diagnoses Final diagnoses:  Renal colic    Rx / DC Orders ED Discharge Orders          Ordered    tamsulosin (FLOMAX) 0.4 MG CAPS capsule  Daily        03/01/22 1324    ondansetron (ZOFRAN) 4 MG tablet  Every 6 hours        03/01/22 1324    HYDROcodone-acetaminophen (NORCO/VICODIN) 5-325 MG tablet  Every 6 hours PRN        03/01/22 1324              Valarie Merino, MD 03/01/22 1435    Valarie Merino, MD 03/01/22 1443

## 2022-03-01 NOTE — Discharge Instructions (Addendum)
Return for any problem.  ?

## 2022-07-23 ENCOUNTER — Encounter: Payer: Self-pay | Admitting: Gastroenterology

## 2022-08-26 ENCOUNTER — Encounter: Payer: Self-pay | Admitting: Gastroenterology

## 2022-10-30 ENCOUNTER — Telehealth: Payer: Self-pay | Admitting: Gastroenterology

## 2022-10-30 NOTE — Telephone Encounter (Signed)
Inbound call from patient requesting to reschedule 9/12 office visit. Patient states he has stopped taking Eliquis and has been off medication for 1 month. Patient is requesting to know if he is still need office visit, now scheduled for 11/18, prior to colonoscopy. Please advise, thank you.

## 2022-11-03 NOTE — Telephone Encounter (Signed)
Thank you for the note, and I am glad he contacted [place].  This patient can be directly booked for his colonoscopy with me in the [facility].  He needs an endoscopy nurse previsit for preparation instructions.  H Danis

## 2022-11-05 NOTE — Telephone Encounter (Signed)
Called patient to schedule colonoscopy and pv. Left voicemail.

## 2022-11-06 ENCOUNTER — Ambulatory Visit: Payer: 59 | Admitting: Gastroenterology

## 2023-01-12 ENCOUNTER — Ambulatory Visit (INDEPENDENT_AMBULATORY_CARE_PROVIDER_SITE_OTHER): Payer: Medicare Other | Admitting: Gastroenterology

## 2023-01-12 ENCOUNTER — Encounter: Payer: Self-pay | Admitting: Gastroenterology

## 2023-01-12 VITALS — BP 122/70 | HR 100 | Ht 71.0 in | Wt 232.4 lb

## 2023-01-12 DIAGNOSIS — Z860100 Personal history of colon polyps, unspecified: Secondary | ICD-10-CM

## 2023-01-12 MED ORDER — NA SULFATE-K SULFATE-MG SULF 17.5-3.13-1.6 GM/177ML PO SOLN: 1.0000 | ORAL | 0 refills | Status: DC

## 2023-01-12 NOTE — Progress Notes (Signed)
Chief Complaint: history of colon polyps Primary GI MD: Dr. Myrtie Neither  HPI: 67 year old male history of hyperlipidemia and atrial fibrillation presents to schedule his colonoscopy.  Patient had a few bouts of afib earlier this year and was evaluated by cardiology. He was put on Eliquis for about 4 months and then his cardiologist did not feel it was needed as his afib was paroxysmal. He takes flecainide on a prn basis.  Denies GI symptoms today and has no complaints.  Recent TTE March 2022 with EF 55-60%    PREVIOUS GI WORKUP   Colonoscopy 10/20/2017 - One 8 mm polyp (tubular adenoma) in the ascending colon, removed with a cold snare. Resected and retrieved.  - One 12 mm polyp (sessile serrated polyp without dysplasia) in the ascending colon, removed with a hot snare. Resected and retrieved.  - Diverticulosis in the left colon.  - The examination was otherwise normal on direct and retroflexion views. - Repeat 5 years (09/2022)  Past Medical History:  Diagnosis Date   A-fib [facility])    Hyperlipidemia     Past Surgical History:  Procedure Laterality Date   arm fracture  2013   broken left arm and elbow   HERNIA REPAIR  1976   inguinal hernia on left side    Current Outpatient Medications  Medication Sig Dispense Refill   atorvastatin (LIPITOR) 10 MG tablet Take 10 mg by mouth daily.     ezetimibe (ZETIA) 10 MG tablet Take 10 mg by mouth daily.     flecainide (TAMBOCOR) 50 MG tablet Take 50 mg by mouth 2 (two) times daily.     Na Sulfate-K Sulfate-Mg Sulf (SUPREP BOWEL PREP KIT) 17.5-3.13-1.6 GM/177ML SOLN Take 1 kit by mouth as directed. For colonoscopy prep 354 mL 0   sertraline (ZOLOFT) 50 MG tablet Take 50 mg by mouth daily.     acetaminophen (TYLENOL) 500 MG tablet Take 1,000 mg by mouth every 6 (six) hours as needed. For pain (Patient not taking: Reported on 01/12/2023)     Current Facility-Administered Medications  Medication Dose Route Frequency Provider Last Rate Last  Admin   0.9 %  sodium chloride infusion  500 mL Intravenous Once Sherrilyn Rist, MD        Allergies as of 01/12/2023   (No Known Allergies)    Family History  Problem Relation Age of Onset   Cancer Father    Head & neck cancer Father    Skin cancer Mother    Skin cancer Brother     Social History   Socioeconomic History   Marital status: Married    Spouse name: Not on file   Number of children: 2   Years of education: Not on file   Highest education level: Not on file  Occupational History   Not on file  Tobacco Use   Smoking status: Former    Current packs/day: 0.00    Types: Cigarettes    Quit date: 09/26/2011    Years since quitting: 11.3   Smokeless tobacco: Never  Vaping Use   Vaping status: Never Used  Substance and Sexual Activity   Alcohol use: Yes    Alcohol/week: 7.0 - 10.0 standard drinks of alcohol    Types: 7 - 10 Standard drinks or equivalent per week   Drug use: Not Currently    Comment: every day or other day   Sexual activity: Not on file  Other Topics Concern   Not on file  Social History Narrative  Not on file   Social Determinants of Health   Financial Resource Strain: Low Risk  (05/28/2020)   [facility] prior to 04/26/2022., [facility] visits prior to 04/26/2022.   Overall Financial Resource Strain (CARDIA)    Difficulty of Paying Living Expenses: Not hard at all  Food Insecurity: Low Risk  (08/20/2022)   [facility]   Hunger Vital Sign    Worried About Running Out of Food in the Last Year: Never true    Ran Out of Food in the Last Year: Never true  Transportation Needs: No Transportation Needs (08/20/2022)   [facility]   Transportation    In the past 12 months, has lack of reliable transportation kept you from medical appointments, meetings, work or from getting things needed for daily living? : No  Physical  Activity: Insufficiently Active (05/28/2020)   Received from [facility] visits prior to 04/26/2022., [facility] visits prior to 04/26/2022.   Exercise Vital Sign    Days of Exercise per Week: 3 days    Minutes of Exercise per Session: 40 min  Stress: No Stress Concern Present (05/28/2020)   [facility] prior to 04/26/2022., [facility] visits prior to 04/26/2022.   [employer] of Occupational Health - Occupational Stress Questionnaire    Feeling of Stress : Only a little  Social Connections: Unknown (05/28/2020)   [facility] prior to 04/26/2022., [facility] visits prior to 04/26/2022.   Social Connection and Isolation Panel [NHANES]    Frequency of Communication with Friends and Family: Twice a week    Frequency of Social Gatherings with Friends and Family: Three times a week    Attends Religious Services: Patient refused    Active Member of Clubs or Organizations: No    Attends Banker Meetings: Patient refused    Marital Status: Married  Catering manager Violence: Not At Risk (05/28/2020)   [facility] prior to 04/26/2022., [facility] visits prior to 04/26/2022.   Humiliation, Afraid, Rape, and Kick questionnaire    Fear of Current or Ex-Partner: No    Emotionally Abused: No    Physically Abused: No    Sexually Abused: No    Review of Systems:    Constitutional: No weight loss, fever, chills, weakness or fatigue HEENT: Eyes: No change in vision               Ears, Nose, Throat:  No change in hearing or congestion Skin: No rash or itching Cardiovascular: No chest pain, chest pressure or palpitations   Respiratory: No SOB or cough Gastrointestinal: See HPI and otherwise negative Genitourinary: No dysuria or change in urinary  frequency Neurological: No headache, dizziness or syncope Musculoskeletal: No new muscle or joint pain Hematologic: No bleeding or bruising Psychiatric: No history of depression or anxiety    Physical Exam:  Vital signs: BP 122/70   Pulse 100   Ht 5\' 11"  (1.803 m)   Wt 232 lb 6 oz (105.4 kg)   BMI 32.41 kg/m   Constitutional: NAD, Well developed, Well nourished, alert and cooperative Head:  Normocephalic and atraumatic. Eyes:   PEERL, EOMI. No icterus. Conjunctiva pink. Respiratory: Respirations even and unlabored. Lungs clear to auscultation bilaterally.  No wheezes, crackles, or rhonchi.  Cardiovascular:  Regular rate and rhythm. No peripheral edema, cyanosis or pallor.  Rectal:  Not performed.  Msk:  Symmetrical without gross deformities. Without edema, no deformity or joint abnormality.  Neurologic:  Alert and  oriented x4;  grossly normal neurologically.  Skin:   Dry and intact without significant lesions or rashes. Psychiatric: Oriented to person, place and time. Demonstrates good judgement and reason without abnormal affect or behaviors.   RELEVANT LABS AND IMAGING: CBC    Component Value Date/Time   WBC 13.3 (H) 03/01/2022 1150   RBC 5.18 03/01/2022 1150   HGB 15.6 03/01/2022 1150   HCT 45.8 03/01/2022 1150   PLT 268 03/01/2022 1150   MCV 88.4 03/01/2022 1150   MCH 30.1 03/01/2022 1150   MCHC 34.1 03/01/2022 1150   RDW 12.4 03/01/2022 1150   LYMPHSABS 1.7 04/10/2011 1434   MONOABS 1.0 04/10/2011 1434   EOSABS 0.0 04/10/2011 1434   BASOSABS 0.0 04/10/2011 1434    CMP     Component Value Date/Time   NA 140 03/01/2022 1330   K 4.0 03/01/2022 1330   CL 108 03/01/2022 1330   CO2 22 03/01/2022 1330   GLUCOSE 98 03/01/2022 1330   BUN 25 (H) 03/01/2022 1330   CREATININE 1.48 (H) 03/01/2022 1330   CALCIUM 8.9 03/01/2022 1330   GFRNONAA 52 (L) 03/01/2022 1330   GFRAA >90 04/10/2011 1434     Assessment/Plan:   History of colon polyps Last colonoscopy in  2019 with tubular adenoma and sessile serrated adenoma with repeat recommended 09/2022. Not on blood thinner, adequate EF per ECHO, no GI symptoms --- schedule colonoscopy in the [facility] --- I thoroughly discussed the procedure with the patient (at bedside) to include nature of the procedure, alternatives, benefits, and risks (including but not limited to bleeding, infection, perforation, anesthesia/cardiac pulmonary complications).  Patient verbalized understanding and gave verbal consent to proceed with procedure.   Atrial Fibrillation Follows with cardiology. On Flecainide prn. No longer on Eliquis. Adequate EF per TTE March 2022   Boone Master, [facility] 01/12/2023, 2:30 PM  Cc: Richmond Campbell., PA-C

## 2023-01-12 NOTE — Patient Instructions (Signed)
We have sent the following medications to your pharmacy for you to pick up at your convenience: Suprep   You have been scheduled for a colonoscopy. Please follow written instructions given to you at your visit today.   Please pick up your prep supplies at the pharmacy within the next 1-3 days.  If you use inhalers (even only as needed), please bring them with you on the day of your procedure.  DO NOT TAKE 7 DAYS PRIOR TO TEST- Trulicity (dulaglutide) Ozempic, Wegovy (semaglutide) Mounjaro (tirzepatide) Bydureon Bcise (exanatide extended release)  DO NOT TAKE 1 DAY PRIOR TO YOUR TEST Rybelsus (semaglutide) Adlyxin (lixisenatide) Victoza (liraglutide) Byetta (exanatide) ___________________________________________________________________________  Due to recent changes in healthcare laws, you may see the results of your imaging and laboratory studies on MyChart before your provider has had a chance to review them.  We understand that in some cases there may be results that are confusing or concerning to you. Not all laboratory results come back in the same time frame and the provider may be waiting for multiple results in order to interpret others.  Please give [place] 48 hours in order for your provider to thoroughly review all the results before contacting the office for clarification of your results.   Thank you for choosing me and [facility].  Bayley McMichael PA-C

## 2023-01-13 NOTE — Progress Notes (Signed)
____________________________________________________________  Attending physician addendum:  Thank you for sending this case to me. I have reviewed the entire note and agree with the plan.   Malyssa Maris Danis, MD  ____________________________________________________________  

## 2023-01-30 ENCOUNTER — Encounter: Payer: Self-pay | Admitting: Gastroenterology

## 2023-01-30 ENCOUNTER — Ambulatory Visit (AMBULATORY_SURGERY_CENTER): Payer: Medicare Other | Admitting: Gastroenterology

## 2023-01-30 VITALS — BP 124/81 | HR 64 | Temp 97.4°F | Resp 14 | Ht 71.0 in | Wt 232.0 lb

## 2023-01-30 DIAGNOSIS — K635 Polyp of colon: Secondary | ICD-10-CM

## 2023-01-30 DIAGNOSIS — Z1211 Encounter for screening for malignant neoplasm of colon: Secondary | ICD-10-CM | POA: Diagnosis not present

## 2023-01-30 DIAGNOSIS — K6389 Other specified diseases of intestine: Secondary | ICD-10-CM | POA: Diagnosis not present

## 2023-01-30 DIAGNOSIS — K648 Other hemorrhoids: Secondary | ICD-10-CM

## 2023-01-30 DIAGNOSIS — K5730 Diverticulosis of large intestine without perforation or abscess without bleeding: Secondary | ICD-10-CM | POA: Diagnosis not present

## 2023-01-30 DIAGNOSIS — D123 Benign neoplasm of transverse colon: Secondary | ICD-10-CM

## 2023-01-30 DIAGNOSIS — Z860101 Personal history of adenomatous and serrated colon polyps: Secondary | ICD-10-CM

## 2023-01-30 DIAGNOSIS — Z860100 Personal history of colon polyps, unspecified: Secondary | ICD-10-CM

## 2023-01-30 MED ORDER — SODIUM CHLORIDE 0.9 % IV SOLN: 500.0000 mL | Freq: Once | INTRAVENOUS | Status: DC

## 2023-01-30 NOTE — Patient Instructions (Addendum)
- Resume previous diet.                           - Continue present medications.                           - Await pathology results.                           - Repeat colonoscopy in 5 years for surveillance.                            (Consume more water with prep for next exam)  YOU HAD AN ENDOSCOPIC PROCEDURE TODAY AT THE [facility]:   Refer to the procedure report that was given to you for any specific questions about what was found during the examination.  If the procedure report does not answer your questions, please call your gastroenterologist to clarify.  If you requested that your care partner not be given the details of your procedure findings, then the procedure report has been included in a sealed envelope for you to review at your convenience later.  YOU SHOULD EXPECT: Some feelings of bloating in the abdomen. Passage of more gas than usual.  Walking can help get rid of the air that was put into your GI tract during the procedure and reduce the bloating. If you had a lower endoscopy (such as a colonoscopy or flexible sigmoidoscopy) you may notice spotting of blood in your stool or on the toilet paper. If you underwent a bowel prep for your procedure, you may not have a normal bowel movement for a few days.  Please Note:  You might notice some irritation and congestion in your nose or some drainage.  This is from the oxygen used during your procedure.  There is no need for concern and it should clear up in a day or so.  SYMPTOMS TO REPORT IMMEDIATELY:  Following lower endoscopy (colonoscopy or flexible sigmoidoscopy):  Excessive amounts of blood in the stool  Significant tenderness or worsening of abdominal pains  Swelling of the abdomen that is new, acute  Fever of 100F or higher   For urgent or emergent issues, a gastroenterologist can be reached at any hour by calling [phone]. Do not use MyChart messaging for urgent concerns.     DIET:  We do recommend a small meal at first, but then you may proceed to your regular diet.  Drink plenty of fluids but you should avoid alcoholic beverages for 24 hours.  ACTIVITY:  You should plan to take it easy for the rest of today and you should NOT DRIVE or use heavy machinery until tomorrow (because of the sedation medicines used during the test).    FOLLOW UP: Our staff will call the number listed on your records the next business day following your procedure.  We will call around 7:15- 8:00 am to check on you and address any questions or concerns that you may have regarding the information given to you following your procedure. If we do not reach you, we will leave a message.     If any biopsies were taken you will be contacted by phone or by letter within the next 1-3 weeks.  Please call us at [phone] if you have not heard about the biopsies in 3 weeks.  SIGNATURES/CONFIDENTIALITY: You and/or your care partner have signed paperwork which will be entered into your electronic medical record.  These signatures attest to the fact that that the information above on your After Visit Summary has been reviewed and is understood.  Full responsibility of the confidentiality of this discharge information lies with you and/or your care-partner.

## 2023-01-30 NOTE — Progress Notes (Signed)
Sedate, gd SR, tolerated procedure well, VSS, report to RN 

## 2023-01-30 NOTE — Op Note (Signed)
[facility] Patient Name: Aaron Wise Procedure Date: 01/30/2023 11:55 AM MRN: [id number] Endoscopist: Sherilyn Cooter L. Myrtie Neither , MD, [id number] Age: 67 Referring MD:  Date of Birth: Oct 27, 1955 Gender: Male Account #: [id number] Procedure:                Colonoscopy Indications:              Surveillance: Personal history of adenomatous and                            serrated polyps on last colonoscopy 5 years ago                           8mm right colon TA and 12mm SSP August 2019 Medicines:                Monitored Anesthesia Care Procedure:                Pre-Anesthesia Assessment:                           - Prior to the procedure, a History and Physical                            was performed, and patient medications and                            allergies were reviewed. The patient's tolerance of                            previous anesthesia was also reviewed. The risks                            and benefits of the procedure and the sedation                            options and risks were discussed with the patient.                            All questions were answered, and informed consent                            was obtained. Prior Anticoagulants: The patient has                            taken no anticoagulant or antiplatelet agents. ASA                            Grade Assessment: II - A patient with mild systemic                            disease. After reviewing the risks and benefits,                            the patient was deemed in satisfactory condition to  undergo the procedure.                           After obtaining informed consent, the colonoscope                            was passed under direct vision. Throughout the                            procedure, the patient's blood pressure, pulse, and                            oxygen saturations were monitored continuously. The                            Olympus Scope [id number]  [id number] was introduced through the                            anus and advanced to the the cecum, identified by                            appendiceal orifice and ileocecal valve. The                            colonoscopy was somewhat difficult due to multiple                            diverticula in the colon and a redundant colon.                            Successful completion of the procedure was aided by                            using manual pressure and straightening and                            shortening the scope to obtain bowel loop                            reduction. The patient tolerated the procedure                            well. The quality of the bowel preparation was good                            after lavage of dark bilious fluid throughout the                            colon. The ileocecal valve, appendiceal orifice,                            and rectum were photographed. Scope In: 11:57:52 AM Scope Out: 12:16:58 PM Scope Withdrawal Time: 0 hours 15 minutes 58 seconds  Total Procedure  Duration: 0 hours 19 minutes 6 seconds  Findings:                 The perianal and digital rectal examinations were                            normal.                           Repeat examination of right colon under NBI                            performed.                           Two sessile polyps were found in the transverse                            colon. The polyps were diminutive in size. These                            polyps were removed with a cold snare. Resection                            and retrieval were complete.                           Multiple diverticula were found in the left colon.                           Internal hemorrhoids were found. The hemorrhoids                            were medium-sized.                           The exam was otherwise without abnormality on                            direct and retroflexion views. Complications:             No immediate complications. Estimated Blood Loss:     Estimated blood loss was minimal. Impression:               - Two diminutive polyps in the transverse colon,                            removed with a cold snare. Resected and retrieved.                           - Diverticulosis in the left colon.                           - Internal hemorrhoids.                           - The examination was otherwise normal on direct  and retroflexion views. Recommendation:           - Patient has a contact number available for                            emergencies. The signs and symptoms of potential                            delayed complications were discussed with the                            patient. Return to normal activities tomorrow.                            Written discharge instructions were provided to the                            patient.                           - Resume previous diet.                           - Continue present medications.                           - Await pathology results.                           (Consume more water with prep for next exam)                           - Repeat colonoscopy in 5 years for surveillance.                            (Consume more water with prep for next exam) Sherilyn Cooter L. Myrtie Neither, MD 01/30/2023 12:31:00 PM This report has been signed electronically.

## 2023-01-30 NOTE — Progress Notes (Signed)
History and Physical:  This patient presents for endoscopic testing for: Encounter Diagnosis  Name Primary?   History of colonic polyps Yes    Surveillance colonoscopy for Hx of TA and SSP polyps in August 2019. Patient denies chronic abdominal pain, rectal bleeding, constipation or diarrhea.   Patient is otherwise without complaints or active issues today.   Past Medical History: Past Medical History:  Diagnosis Date   A-fib (HCC)    Hyperlipidemia      Past Surgical History: Past Surgical History:  Procedure Laterality Date   arm fracture  2013   broken left arm and elbow   HERNIA REPAIR  1976   inguinal hernia on left side    Allergies: No Known Allergies  Outpatient Meds: Current Outpatient Medications  Medication Sig Dispense Refill   atorvastatin (LIPITOR) 10 MG tablet Take 10 mg by mouth daily.     ezetimibe (ZETIA) 10 MG tablet Take 10 mg by mouth daily.     flecainide (TAMBOCOR) 50 MG tablet Take 50 mg by mouth 2 (two) times daily.     sertraline (ZOLOFT) 50 MG tablet Take 50 mg by mouth daily.     acetaminophen (TYLENOL) 500 MG tablet Take 1,000 mg by mouth every 6 (six) hours as needed. For pain (Patient not taking: Reported on 01/12/2023)     Current Facility-Administered Medications  Medication Dose Route Frequency Provider Last Rate Last Admin   0.9 %  sodium chloride infusion  500 mL Intravenous Once Danis, Starr Lake III, MD       0.9 %  sodium chloride infusion  500 mL Intravenous Once Danis, Andreas Blower, MD          ___________________________________________________________________ Objective   Exam:  BP 132/79   Pulse 77   Temp (!) 97.4 F (36.3 C) (Skin)   Ht 5\' 11"  (1.803 m)   Wt 232 lb (105.2 kg)   SpO2 98%   BMI 32.36 kg/m   CV: regular , S1/S2 Resp: clear to auscultation bilaterally, normal RR and effort noted GI: soft, no tenderness, with active bowel sounds.   Assessment: Encounter Diagnosis  Name Primary?   History of  colonic polyps Yes     Plan: Colonoscopy   The benefits and risks of the planned procedure were described in detail with the patient or (when appropriate) their health care proxy.  Risks were outlined as including, but not limited to, bleeding, infection, perforation, adverse medication reaction leading to cardiac or pulmonary decompensation, pancreatitis (if ERCP).  The limitation of incomplete mucosal visualization was also discussed.  No guarantees or warranties were given.  The patient is appropriate for an endoscopic procedure in the ambulatory setting.   - Amada Jupiter, MD

## 2023-01-30 NOTE — Progress Notes (Signed)
Called to room to assist during endoscopic procedure.  Patient ID and intended procedure confirmed with present staff. Received instructions for my participation in the procedure from the performing physician.  

## 2023-01-30 NOTE — Progress Notes (Signed)
Pt's states no medical or surgical changes since previsit or office visit. 

## 2023-02-02 ENCOUNTER — Telehealth: Payer: Self-pay | Admitting: *Deleted

## 2023-02-02 NOTE — Telephone Encounter (Signed)
  Follow up Call-     01/30/2023   11:05 AM  Call back number  Post procedure Call Back phone  # [phone]  Permission to leave phone message Yes     Patient questions:  Do you have a fever, pain , or abdominal swelling? No. Pain Score  0 *  Have you tolerated food without any problems? Yes.    Have you been able to return to your normal activities? Yes.    Do you have any questions about your discharge instructions: Diet   No. Medications  No. Follow up visit  No.  Do you have questions or concerns about your Care? No.  Actions: * If pain score is 4 or above: No action needed, pain <4.

## 2023-02-03 LAB — SURGICAL PATHOLOGY

## 2023-02-06 ENCOUNTER — Encounter: Payer: Self-pay | Admitting: Gastroenterology
